# Patient Record
Sex: Male | Born: 1990 | Race: Black or African American | Hispanic: No | Marital: Single | Smoking: Never smoker
Health system: Southern US, Community
[De-identification: ages and names within clinical notes are randomized; demographics above are authoritative.]

---
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
## 2011-05-11 ENCOUNTER — Encounter (HOSPITAL_COMMUNITY): Payer: Self-pay

## 2011-05-11 ENCOUNTER — Emergency Department (HOSPITAL_COMMUNITY)
Admission: EM | Admit: 2011-05-11 | Discharge: 2011-05-11 | Disposition: A | Discharge status: Home / Self Care | Payer: Medicaid - Out of State | Attending: Emergency Medicine | Admitting: Emergency Medicine

## 2011-05-11 DIAGNOSIS — IMO0002 Reserved for concepts with insufficient information to code with codable children: Secondary | ICD-10-CM

## 2011-05-11 DIAGNOSIS — L03019 Cellulitis of unspecified finger: Secondary | ICD-10-CM | POA: Insufficient documentation

## 2011-05-11 MED ORDER — BUPIVACAINE HCL (PF) 0.5 % IJ SOLN
20.0000 mL | Freq: Once | INTRAMUSCULAR | Status: DC
  Filled 2011-05-11: qty 30

## 2011-05-11 MED ORDER — HYDROCODONE-ACETAMINOPHEN 5-325 MG PO TABS
1.0000 | ORAL_TABLET | Freq: Four times a day (QID) | ORAL | Status: AC | PRN
Start: 2011-05-11 — End: 2011-05-21

## 2011-05-11 NOTE — ED Notes (Signed)
Left thumb swelling started 2 days ago. Hangnail per pt.

## 2011-05-11 NOTE — ED Provider Notes (Signed)
History     CSN: [id number]  Arrival date & time 05/11/11  0003   First MD Initiated Contact with Patient 05/11/11 0055      Chief Complaint  Patient presents with  . Finger swelling     (Consider location/radiation/quality/duration/timing/severity/associated sxs/prior treatment) HPI This is a 21 year old black male with a two-day history of pain along the radial side of his left thumbnail. He states it started as a hangnail. There is now moderate pain and tenderness associated with it. There is no pain or tenderness of the pad of the left for him. He denies systemic symptoms such as fever or chills. He denies frank injury. The pain is worse with movement or palpation. He has not done anything to treat the pain.  History reviewed. No pertinent past medical history.  History reviewed. No pertinent past surgical history.  History reviewed. No pertinent family history.  History  Substance Use Topics  . Smoking status: Not on file  . Smokeless tobacco: Not on file  . Alcohol Use: Not on file      Review of Systems  All other systems reviewed and are negative.    Allergies  Amoxicillin  Home Medications  No current outpatient prescriptions on file.  BP 129/64  Pulse 66  Temp(Src) 98.3 F (36.8 C) (Oral)  Resp 20  Ht 5\' 9"  (1.753 m)  Wt 122 lb (55.339 kg)  BMI 18.02 kg/m2  SpO2 98%  Physical Exam General: Well-developed, well-nourished male in no acute distress; appearance consistent with age of record HENT: normocephalic, atraumatic Eyes: normal appearance Neck: supple Heart: regular rate and rhythm Lungs: normal respiratory effort and excursion Abdomen: Soft; nondistended Extremities: No deformity; full range of motion; pulses normal; tenderness and swelling along the radial side of the left thumbnail consistent with paronychia, no tenderness or swelling of pad to suggest a felon Neurologic: Awake, alert and oriented; motor function intact in all extremities  and symmetric; no facial droop Skin: Warm and dry Psychiatric: Normal mood and affect    ED Course  Drain paronychia Date/Time: 05/11/2011 1:49 AM Performed by: Hanley Seamen Authorized by: Hanley Seamen Consent: Verbal consent obtained. Risks and benefits: risks, benefits and alternatives were discussed Consent given by: patient Time out: Immediately prior to procedure a "time out" was called to verify the correct patient, procedure, equipment, support staff and site/side marked as required. Preparation: Patient was prepped and draped in the usual sterile fashion. Local anesthesia used: digital block. Patient tolerance: Patient tolerated the procedure well with no immediate complications. Comments: Moderate pus obtained on incision of left thumb. Pus sent for culture and sensitivity.   (including critical care time)     MDM           Hanley Seamen, MD 05/11/11 0150

## 2011-05-14 LAB — CULTURE, ROUTINE-ABSCESS

## 2011-05-15 NOTE — ED Notes (Signed)
Chart sent to EDP office for review °

## 2011-05-17 NOTE — ED Notes (Signed)
Chart back from EDP office.. No further treatment necessary

## 2011-05-22 ENCOUNTER — Emergency Department (HOSPITAL_COMMUNITY)
Admission: EM | Admit: 2011-05-22 | Discharge: 2011-05-22 | Disposition: A | Discharge status: Home / Self Care | Payer: Medicaid - Out of State | Attending: Emergency Medicine | Admitting: Emergency Medicine

## 2011-05-22 ENCOUNTER — Encounter (HOSPITAL_COMMUNITY): Payer: Self-pay | Admitting: *Deleted

## 2011-05-22 DIAGNOSIS — Z5189 Encounter for other specified aftercare: Secondary | ICD-10-CM

## 2011-05-22 DIAGNOSIS — L03012 Cellulitis of left finger: Secondary | ICD-10-CM

## 2011-05-22 DIAGNOSIS — L03019 Cellulitis of unspecified finger: Secondary | ICD-10-CM | POA: Insufficient documentation

## 2011-05-22 MED ORDER — IBUPROFEN 600 MG PO TABS: 600.0000 mg | ORAL_TABLET | Freq: Four times a day (QID) | ORAL | Status: AC | PRN

## 2011-05-22 NOTE — ED Provider Notes (Signed)
History     CSN: [id number]  Arrival date & time 05/22/11  1608   First MD Initiated Contact with Patient 05/22/11 1615      Chief Complaint  Patient presents with  . Wound Check    (Consider location/radiation/quality/duration/timing/severity/associated sxs/prior treatment) Patient is a 21 y.o. male presenting with wound check. The history is provided by the patient.  Wound Check  He was treated in the ED 10 to 14 days ago. Prior ED Treatment: Drainage of paronychia. Treatments tried: Narcotic pain reliever. There has been no drainage from the wound. The redness has improved. The swelling has improved. The pain has improved. He has no difficulty moving the affected extremity or digit.    History reviewed. No pertinent past medical history.  History reviewed. No pertinent past surgical history.  No family history on file.  History  Substance Use Topics  . Smoking status: Not on file  . Smokeless tobacco: Not on file  . Alcohol Use: Not on file      Review of Systems  Constitutional: Negative for fever.  Musculoskeletal: Negative for joint swelling and arthralgias.  Skin: Negative.  Negative for color change, rash and wound.  Hematological: Negative.   Psychiatric/Behavioral: Negative.     Allergies  Amoxicillin  Home Medications   Current Outpatient Rx  Name Route Sig Dispense Refill  . HYDROCODONE-ACETAMINOPHEN 5-325 MG PO TABS Oral Take 1-2 tablets by mouth every 6 (six) hours as needed for pain. 20 tablet 0  . IBUPROFEN 600 MG PO TABS Oral Take 1 tablet (600 mg total) by mouth every 6 (six) hours as needed for pain. 20 tablet 0    BP 113/67  Pulse 75  Temp 97.7 F (36.5 C)  Resp 16  Ht 5\' 9"  (1.753 m)  Wt 132 lb (59.875 kg)  BMI 19.49 kg/m2  SpO2 100%  Physical Exam  Nursing note and vitals reviewed. Constitutional: He is oriented to person, place, and time. He appears well-developed and well-nourished.  HENT:  Head: Normocephalic and atraumatic.   Neck: Neck supple.  Cardiovascular: Normal rate and intact distal pulses.   Pulmonary/Chest: Effort normal.  Musculoskeletal: Normal range of motion. He exhibits no edema and no tenderness.  Neurological: He is alert and oriented to person, place, and time.  Skin: Skin is warm and dry.       Distal left thumb at the site of I&D has completely sealed, slightly pink skin without erythema, tenderness or fluctuance or induration.  Appears to be healing well.  Psychiatric: He has a normal mood and affect.    ED Course  Procedures (including critical care time)  Labs Reviewed - No data to display No results found.   1. Paronychia of left thumb   2. Visit for wound check       MDM  Patient has continued to the hydrocodone he was originally prescribed, not specifically for pain relief, but thought he was supposed to take it.  Complaint that this medication makes him very drowsy.  I did prescribe ibuprofen 600 mg to use when necessary if he has any continued discomfort.  Otherwise encouraged when necessary followup.  This infection appears to be resolved at this time.        Candis Musa, PA 05/22/11 1724

## 2011-05-22 NOTE — Discharge Instructions (Signed)
Paronychia Paronychia is an inflammatory reaction involving the folds of the skin surrounding the fingernail. This is commonly caused by an infection in the skin around a nail. The most common cause of paronychia is frequent wetting of the hands (as seen with bartenders, food servers, nurses or others who wet their hands). This makes the skin around the fingernail susceptible to infection by bacteria (germs) or fungus. Other predisposing factors are:  Aggressive manicuring.   Nail biting.   Thumb sucking.  The most common cause is a staphylococcal (a type of germ) infection, or a fungal (Candida) infection. When caused by a germ, it usually comes on suddenly with redness, swelling, pus and is often painful. It may get under the nail and form an abscess (collection of pus), or form an abscess around the nail. If the nail itself is infected with a fungus, the treatment is usually prolonged and may require oral medicine for up to one year. Your caregiver will determine the length of time treatment is required. The paronychia caused by bacteria (germs) may largely be avoided by not pulling on hangnails or picking at cuticles. When the infection occurs at the tips of the finger it is called felon. When the cause of paronychia is from the herpes simplex virus (HSV) it is called herpetic whitlow. TREATMENT  When an abscess is present treatment is often incision and drainage. This means that the abscess must be cut open so the pus can get out. When this is done, the following home care instructions should be followed. HOME CARE INSTRUCTIONS   It is important to keep the affected fingers very dry. Rubber or plastic gloves over cotton gloves should be used whenever the hand must be placed in water.   Keep wound clean, dry and dressed as suggested by your caregiver between warm soaks or warm compresses.   Soak in warm water for fifteen to twenty minutes three to four times per day for bacterial infections.  Fungal infections are very difficult to treat, so often require treatment for long periods of time.   For bacterial (germ) infections take antibiotics (medicine which kill germs) as directed and finish the prescription, even if the problem appears to be solved before the medicine is gone.   Only take over-the-counter or prescription medicines for pain, discomfort, or fever as directed by your caregiver.  SEEK IMMEDIATE MEDICAL CARE IF:  You have redness, swelling, or increasing pain in the wound.   You notice pus coming from the wound.   You have a fever.   You notice a bad smell coming from the wound or dressing.  Document Released: 08/13/2000 Document Revised: 02/06/2011 Document Reviewed: 04/14/2008 [facility] Patient Information 2012 [place], [place].    Start taking the new medication as needed for any discomfort in place of your previous pain medicine - this will not make you sleepy. Your infection appears to be healing well.  Get rechecked if it does not continue to resolve completely.

## 2011-05-22 NOTE — ED Notes (Signed)
States he was seen on the 10th of the month for problems with left thumb, states his thumb has not healed

## 2011-05-24 NOTE — ED Provider Notes (Signed)
Medical screening examination/treatment/procedure(s) were performed by non-physician practitioner and as supervising physician I was immediately available for consultation/collaboration.  Linnette Panella T Soriya Worster, MD 05/24/11 1011 

## 2011-07-11 ENCOUNTER — Emergency Department (HOSPITAL_COMMUNITY)
Admission: EM | Admit: 2011-07-11 | Discharge: 2011-07-11 | Disposition: A | Discharge status: Home / Self Care | Payer: Medicaid - Out of State | Attending: Emergency Medicine | Admitting: Emergency Medicine

## 2011-07-11 ENCOUNTER — Encounter (HOSPITAL_COMMUNITY): Payer: Self-pay

## 2011-07-11 DIAGNOSIS — H6120 Impacted cerumen, unspecified ear: Secondary | ICD-10-CM | POA: Insufficient documentation

## 2011-07-11 DIAGNOSIS — H6121 Impacted cerumen, right ear: Secondary | ICD-10-CM

## 2011-07-11 NOTE — ED Notes (Signed)
Pt states his right ear is "plugged"  And feels pressure, denies pain or drainage, nad

## 2011-07-11 NOTE — ED Provider Notes (Signed)
History     CSN: [id number]  Arrival date & time 07/11/11  0101   First MD Initiated Contact with Patient 07/11/11 0204      Chief Complaint  Patient presents with  . Otalgia    (Consider location/radiation/quality/duration/timing/severity/associated sxs/prior treatment) HPI  Jeffrey Steele is a 21 y.o. male who presents to the Emergency Department complaining of right ear pain and a feeling that the ear is plugged. Associated with pressure. No fever, chills, ear drainage, dizziness.  No PCP   .History reviewed. No pertinent past medical history.  History reviewed. No pertinent past surgical history.  No family history on file.  History  Substance Use Topics  . Smoking status: Never Smoker   . Smokeless tobacco: Not on file  . Alcohol Use: No      Review of Systems  Constitutional: Negative for fever.       10 Systems reviewed and are negative for acute change except as noted in the HPI.  HENT: Positive for ear pain. Negative for congestion.   Eyes: Negative for discharge and redness.  Respiratory: Negative for cough and shortness of breath.   Cardiovascular: Negative for chest pain.  Gastrointestinal: Negative for vomiting and abdominal pain.  Musculoskeletal: Negative for back pain.  Skin: Negative for rash.  Neurological: Negative for syncope, numbness and headaches.  Psychiatric/Behavioral:       No behavior change.    Allergies  Amoxicillin  Home Medications  No current outpatient prescriptions on file.  BP 122/67  Pulse 56  Temp(Src) 98 F (36.7 C) (Oral)  Resp 16  Ht 5\' 9"  (1.753 m)  Wt 132 lb (59.875 kg)  BMI 19.49 kg/m2  SpO2 98%  Physical Exam  Nursing note and vitals reviewed. Constitutional:       Awake, alert, nontoxic appearance.  HENT:  Head: Normocephalic and atraumatic.  Left Ear: External ear normal.       Right ear with cerumen impaction  Eyes: Right eye exhibits no discharge. Left eye exhibits no discharge.  Neck: Neck  supple.  Pulmonary/Chest: Effort normal. He exhibits no tenderness.  Abdominal: Soft. There is no tenderness. There is no rebound.  Musculoskeletal: He exhibits no tenderness.       Baseline ROM, no obvious new focal weakness.  Neurological:       Mental status and motor strength appears baseline for patient and situation.  Skin: No rash noted.  Psychiatric: He has a normal mood and affect.    ED Course  Procedures (including critical care time)     MDM  Patient presents with a right ear pain and a sensation of the ear being plugged. There is a cerumen impaction. Nursing irrigated the right ear with success. Pt feels improved after observation and/or treatment in ED.Pt stable in ED with no significant deterioration in condition.The patient appears reasonably screened and/or stabilized for discharge and I doubt any other medical condition or other [facility] requiring further screening, evaluation, or treatment in the ED at this time prior to discharge.  MDM Reviewed: nursing note and vitals           Nicoletta Dress. Colon Branch, MD 07/11/11 [phone]

## 2011-07-11 NOTE — Discharge Instructions (Signed)
Your ear had an excessive build up of wax. We have cleaned it out while you were here.

## 2011-09-23 ENCOUNTER — Emergency Department (HOSPITAL_COMMUNITY)
Admission: EM | Admit: 2011-09-23 | Discharge: 2011-09-23 | Disposition: A | Discharge status: Home / Self Care | Payer: Medicaid - Out of State | Attending: Emergency Medicine | Admitting: Emergency Medicine

## 2011-09-23 ENCOUNTER — Emergency Department (HOSPITAL_COMMUNITY): Payer: Medicaid - Out of State

## 2011-09-23 ENCOUNTER — Encounter (HOSPITAL_COMMUNITY): Payer: Self-pay | Admitting: *Deleted

## 2011-09-23 DIAGNOSIS — M25519 Pain in unspecified shoulder: Secondary | ICD-10-CM | POA: Insufficient documentation

## 2011-09-23 DIAGNOSIS — S4350XA Sprain of unspecified acromioclavicular joint, initial encounter: Secondary | ICD-10-CM

## 2011-09-23 DIAGNOSIS — Y9301 Activity, walking, marching and hiking: Secondary | ICD-10-CM | POA: Insufficient documentation

## 2011-09-23 DIAGNOSIS — W010XXA Fall on same level from slipping, tripping and stumbling without subsequent striking against object, initial encounter: Secondary | ICD-10-CM | POA: Insufficient documentation

## 2011-09-23 MED ORDER — IBUPROFEN 800 MG PO TABS: 800.0000 mg | ORAL_TABLET | Freq: Three times a day (TID) | ORAL | Status: AC

## 2011-09-23 NOTE — ED Provider Notes (Signed)
Medical screening examination/treatment/procedure(s) were performed by non-physician practitioner and as supervising physician I was immediately available for consultation/collaboration.   Lyanne Co, MD 09/23/11 1745

## 2011-09-23 NOTE — ED Provider Notes (Signed)
History     CSN: [id number]  Arrival date & time 09/23/11  1627   First MD Initiated Contact with Patient 09/23/11 1728      Chief Complaint  Patient presents with  . Shoulder Pain    (Consider location/radiation/quality/duration/timing/severity/associated sxs/prior treatment) HPI Comments: Patient states that while walking on yesterday July 22, he fell backwards and injured the right shoulder. Since that time he has had pain with raising the arm over his head. He's not had any problem dropping objects. He's been able to put on his pullover shirts with minimal problem. The patient denies any previous operations or procedures to the right arm or shoulder.   Patient is a 21 y.o. male presenting with shoulder pain. The history is provided by the patient.  Shoulder Pain Pertinent negatives include no abdominal pain, arthralgias, chest pain, coughing or neck pain.    History reviewed. No pertinent past medical history.  History reviewed. No pertinent past surgical history.  No family history on file.  History  Substance Use Topics  . Smoking status: Never Smoker   . Smokeless tobacco: Not on file  . Alcohol Use: No      Review of Systems  Constitutional: Negative for activity change.       All ROS Neg except as noted in HPI  HENT: Negative for nosebleeds and neck pain.   Eyes: Negative for photophobia and discharge.  Respiratory: Negative for cough, shortness of breath and wheezing.   Cardiovascular: Negative for chest pain and palpitations.  Gastrointestinal: Negative for abdominal pain and blood in stool.  Genitourinary: Negative for dysuria, frequency and hematuria.  Musculoskeletal: Negative for back pain and arthralgias.  Skin: Negative.   Neurological: Negative for dizziness, seizures and speech difficulty.  Psychiatric/Behavioral: Negative for hallucinations and confusion.    Allergies  Amoxicillin  Home Medications  No current outpatient prescriptions on  file.  BP 111/59  Pulse 65  Temp 97.4 F (36.3 C)  Resp 20  Ht 5\' 9"  (1.753 m)  Wt 135 lb (61.236 kg)  BMI 19.94 kg/m2  SpO2 100%  Physical Exam  Nursing note and vitals reviewed. Constitutional: He is oriented to person, place, and time. He appears well-developed and well-nourished.  Non-toxic appearance.  HENT:  Head: Normocephalic.  Right Ear: Tympanic membrane and external ear normal.  Left Ear: Tympanic membrane and external ear normal.  Eyes: EOM and lids are normal. Pupils are equal, round, and reactive to light.  Neck: Normal range of motion. Neck supple. Carotid bruit is not present.  Cardiovascular: Normal rate, regular rhythm, normal heart sounds, intact distal pulses and normal pulses.   Pulmonary/Chest: Breath sounds normal. No respiratory distress.  Abdominal: Soft. Bowel sounds are normal. There is no tenderness. There is no guarding.  Musculoskeletal: Normal range of motion.       There is pain with attempted range of motion of the right shoulder. There is no obvious deformity of the right shoulder. There is pain and soreness with raising the right arm above the head. There is no evidence of dislocation. Distal pulses and sensory are intact.  Lymphadenopathy:       Head (right side): No submandibular adenopathy present.       Head (left side): No submandibular adenopathy present.    He has no cervical adenopathy.  Neurological: He is alert and oriented to person, place, and time. He has normal strength. No cranial nerve deficit or sensory deficit.  Skin: Skin is warm and dry.  Psychiatric: He  has a normal mood and affect. His speech is normal.    ED Course  Procedures (including critical care time)  Labs Reviewed - No data to display Dg Shoulder Right  09/23/2011  *RADIOLOGY REPORT*  Clinical Data: Pain after fall.  RIGHT SHOULDER - 2+ VIEW  Comparison: None.  Findings: The distal clavicle is slightly elevated and mild AC joint separation may have occurred.   Clinical correlation recommended.  Otherwise, no evidence of fracture or dislocation.  IMPRESSION: The distal clavicle is slightly elevated and mild AC joint separation may have occurred.  Clinical correlation recommended. Otherwise, no evidence of fracture or dislocation.  Original Report Authenticated By: Fuller Canada, M.D.   Pulse oximetry 100% oral air. Within normal limits by my interpretation.  No diagnosis found.    MDM  I have reviewed nursing notes, vital signs, and all appropriate lab and imaging results for this patient. The x-ray of the right shoulder shows the distal clavicle slightly elevated and mild a.c. joint separation. No definite fracture appreciated. The patient is fitted with a shoulder immobilizer. He is referred to orthopedics for additional evaluation. Patient is advised to use ibuprofen 800 mg 3 times daily for soreness and inflammation.       Kathie Dike, [place] 09/23/11 1744

## 2011-09-23 NOTE — ED Notes (Signed)
Pt c/o rt shoulder pain after falling yesterday. Pt states it hurts to move his shoulder.

## 2011-09-23 NOTE — ED Notes (Signed)
Pain in right upper arm and shoulder area , states he fell yesterday

## 2013-03-01 ENCOUNTER — Encounter (HOSPITAL_COMMUNITY): Payer: Self-pay | Admitting: Emergency Medicine

## 2013-03-01 ENCOUNTER — Emergency Department (HOSPITAL_COMMUNITY)
Admission: EM | Admit: 2013-03-01 | Discharge: 2013-03-01 | Disposition: A | Discharge status: Home / Self Care | Payer: Medicaid - Out of State | Attending: Emergency Medicine | Admitting: Emergency Medicine

## 2013-03-01 DIAGNOSIS — R059 Cough, unspecified: Secondary | ICD-10-CM | POA: Insufficient documentation

## 2013-03-01 DIAGNOSIS — R05 Cough: Secondary | ICD-10-CM | POA: Insufficient documentation

## 2013-03-01 DIAGNOSIS — R066 Hiccough: Secondary | ICD-10-CM

## 2013-03-01 MED ORDER — CHLORPROMAZINE HCL 25 MG PO TABS: 25.0000 mg | ORAL_TABLET | Freq: Three times a day (TID) | ORAL | Status: AC

## 2013-03-01 MED ORDER — CHLORPROMAZINE HCL 25 MG/ML IJ SOLN
50.0000 mg | Freq: Once | INTRAMUSCULAR | Status: AC
  Administered 2013-03-01: 50 mg via INTRAMUSCULAR
  Filled 2013-03-01: qty 2

## 2013-03-01 NOTE — ED Notes (Signed)
Pt sleeping, girlfriend states pt has not had hiccups in a while

## 2013-03-01 NOTE — ED Notes (Signed)
Onset of hiccups this am, stops for brief period the begins again.

## 2013-03-01 NOTE — ED Provider Notes (Signed)
CSN: [id number]     Arrival date & time 03/01/13  2003 History   First MD Initiated Contact with Patient 03/01/13 2044     Chief Complaint  Patient presents with  . Hiccups   (Consider location/radiation/quality/duration/timing/severity/associated sxs/prior Treatment) HPI Comments: CHRISS Jeffrey Steele is a 22 y.o. male who presents to the Emergency Department complaining of persistent hiccups.  States that he began coughing this morning and developed hiccups shortly after.  He states the symptoms have been waxing and waning all day.  He denies fever, chills, headaches, abdominal pain, shortness of breath or vomiting.  Drank water and took robitussin today w/o relief.    The history is provided by the patient.    History reviewed. No pertinent past medical history. History reviewed. No pertinent past surgical history. No family history on file. History  Substance Use Topics  . Smoking status: Never Smoker   . Smokeless tobacco: Not on file  . Alcohol Use: No    Review of Systems  Constitutional: Negative for fever, chills, activity change and appetite change.  HENT: Negative for congestion, trouble swallowing and voice change.        Hiccups  Respiratory: Positive for cough. Negative for chest tightness and shortness of breath.   Cardiovascular: Negative for chest pain.  Gastrointestinal: Negative for nausea, vomiting and abdominal pain.  Skin: Negative for rash.  Neurological: Negative for dizziness, speech difficulty and headaches.  All other systems reviewed and are negative.    Allergies  Amoxicillin  Home Medications   Current Outpatient Rx  Name  Route  Sig  Dispense  Refill  . acetaminophen (TYLENOL) 325 MG tablet   Oral   Take 650 mg by mouth every 6 (six) hours as needed.         Marland Kitchen guaifenesin (ROBITUSSIN) 100 MG/5ML syrup   Oral   Take 200 mg by mouth 3 (three) times daily as needed for cough.          BP 129/62  Pulse 96  Temp(Src) 98.2 F (36.8 C)  (Oral)  Resp 18  Ht 5\' 9"  (1.753 m)  Wt 132 lb (59.875 kg)  BMI 19.48 kg/m2  SpO2 100%  Physical Exam  Nursing note and vitals reviewed. Constitutional: He is oriented to person, place, and time. He appears well-developed and well-nourished. No distress.  HENT:  Head: Normocephalic and atraumatic.  Right Ear: Tympanic membrane and ear canal normal.  Left Ear: Tympanic membrane and ear canal normal.  Mouth/Throat: Uvula is midline, oropharynx is clear and moist and mucous membranes are normal. No oropharyngeal exudate.  Persistent hiccups, airway clear, no edema.  Eyes: EOM are normal. Pupils are equal, round, and reactive to light.  Neck: Normal range of motion, full passive range of motion without pain and phonation normal. Neck supple. No thyromegaly present.  Cardiovascular: Normal rate, regular rhythm, normal heart sounds and intact distal pulses.   No murmur heard. Pulmonary/Chest: Effort normal and breath sounds normal. No stridor. No respiratory distress. He has no wheezes. He has no rales. He exhibits no tenderness.  Abdominal: Soft. He exhibits no distension and no mass. There is no tenderness. There is no rebound and no guarding.  Musculoskeletal: Normal range of motion. He exhibits no edema.  Lymphadenopathy:    He has no cervical adenopathy.  Neurological: He is alert and oriented to person, place, and time. He exhibits normal muscle tone. Coordination normal.  Skin: Skin is warm and dry.    ED Course  Procedures (  including critical care time) Labs Review Labs Reviewed - No data to display Imaging Review No results found.  EKG Interpretation   None       MDM   Patient with intermittent episodes of hiccups after coughing.  Likely result of irritation to the diaphragm.  Will give IM thorazine.    Patient is otherwise well appearing, airway patent, VSS  Sx's resolved, patient resting comfortably.  Appears stable for discharge.     Shamira Toutant L. Trisha Mangle,  PA-C 03/02/13 0126

## 2013-03-01 NOTE — ED Notes (Signed)
Patient given discharge instruction, verbalized understand. Patient ambulatory out of the department.  

## 2013-03-01 NOTE — Discharge Instructions (Signed)
Hiccups °Hiccups are caused by a sudden contraction of the muscles between the ribs and the muscle under your lungs (diaphragm). When you hiccup, the top of your windpipe (glottis) closes immediately after your diaphragm contracts. This makes the typical 'hic' sound. A hiccup is a reflex that you cannot stop. Unlike other reflexes such as coughing and sneezing, hiccups do not seem to have any useful purpose. There are 3 types of hiccups:  °· Benign bouts: last less than 48 hours. °· Persistent: last more than 48 hours but less than 1 month. °· Intractable: last more than 1 month. °CAUSES  °Most people have bouts of hiccups from time to time. They start for no apparent reason, last a short while, and then stop. Sometimes they are due to: °· A temporary swollen stomach caused by overeating or eating too fast, eating spicy foods, drinking fizzy drinks, or swallowing air. °· A sudden change in temperature (very hot or cold foods or drinks, a cold shower). °· Drinking alcohol or using tobacco. °There are no particular tests used to diagnose hiccups. Hiccups are usually considered harmless and do not point to a serious medical condition. However, there can be underlying medical problems that may cause hiccups, such as pneumonia, diabetes, metabolic problems, tumors, abdominal infections or injuries, and neurologic problems. You must follow up with your caregiver if your symptoms persist or become a frequent problem. °TREATMENT  °· Most cases need no treatment. A bout of benign hiccups usually does not last long. °· Medicine is sometimes needed to stop persistent hiccups. Medicine may be given intravenously (IV) or by mouth. °· Hypnosis or acupuncture may be suggested. °· Surgery to affect the nerve that supplies the diaphragm may be tried in severe cases. °· Treatment of an underlying cause is needed in some cases. °HOME CARE INSTRUCTIONS  °Popular remedies that may stop a bout of hiccups include: °· Gargling ice  water. °· Swallowing granulated sugar. °· Biting on a lemon. °· Holding your breath, breathing fast, or breathing into a paper bag. °· Bearing down. °· Gasping after a sudden fright. °· Pulling your tongue gently. °· Distraction. °SEEK MEDICAL CARE IF:  °· Hiccups last for more than 48 hours. °· You are given medicine but your hiccups do not get better. °· New symptoms show up. °· You cannot sleep or eat due to the hiccups. °· You have unexpected weight loss. °· You have trouble breathing or swallowing. °· You develop severe pain in your abdomen or other areas. °· You develop numbness, tingling, or weakness. °Document Released: 04/28/2001 Document Revised: 05/12/2011 Document Reviewed: 04/10/2010 °[employer]® Patient Information ©2014 [employer]. ° °

## 2013-03-04 NOTE — ED Provider Notes (Signed)
Medical screening examination/treatment/procedure(s) were performed by non-physician practitioner and as supervising physician I was immediately available for consultation/collaboration.  EKG Interpretation   None        Simrin Vegh R. Vinnie Gombert, MD 03/04/13 2217 

## 2013-07-07 IMAGING — CR DG SHOULDER 2+V*R*
3 series · 3 of 3 positions shown · non-contrast
Comparison: None.

CLINICAL DATA: Pain after fall.

RIGHT SHOULDER - 2+ VIEW

[view not recorded (1 of 3)]
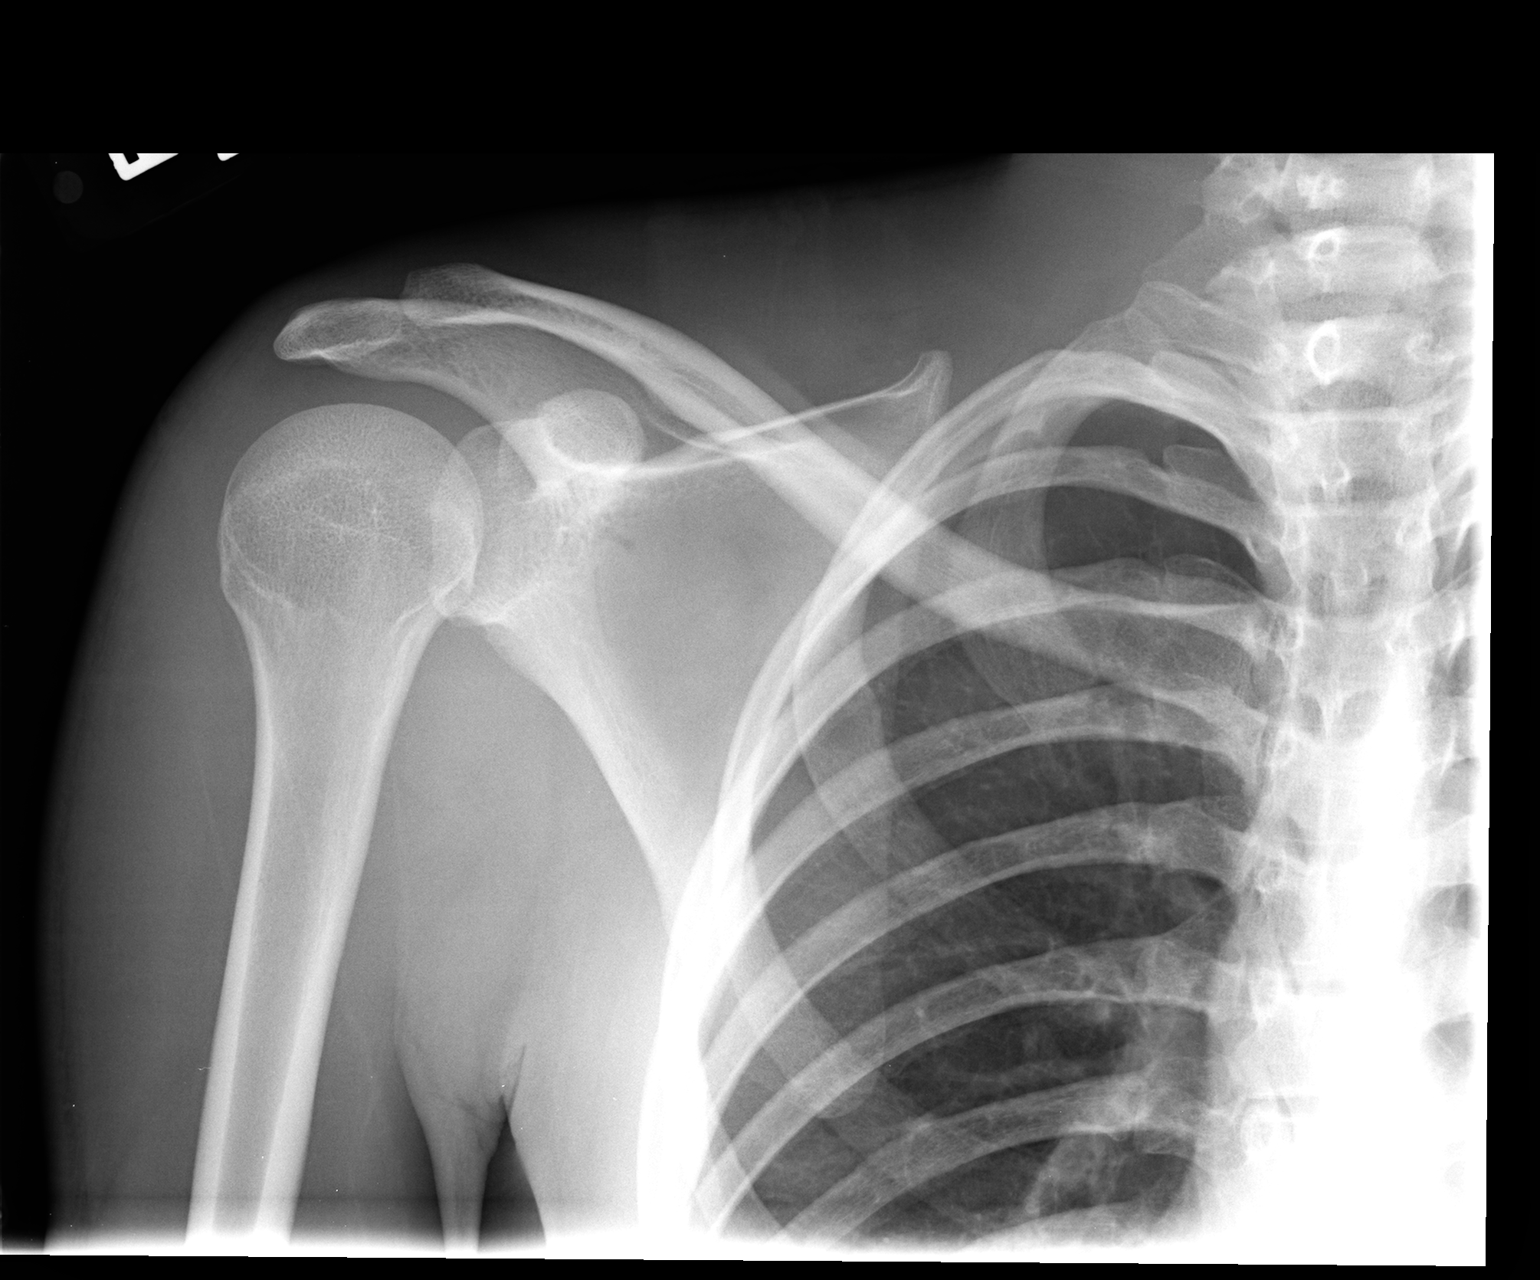

[view not recorded (2 of 3)]
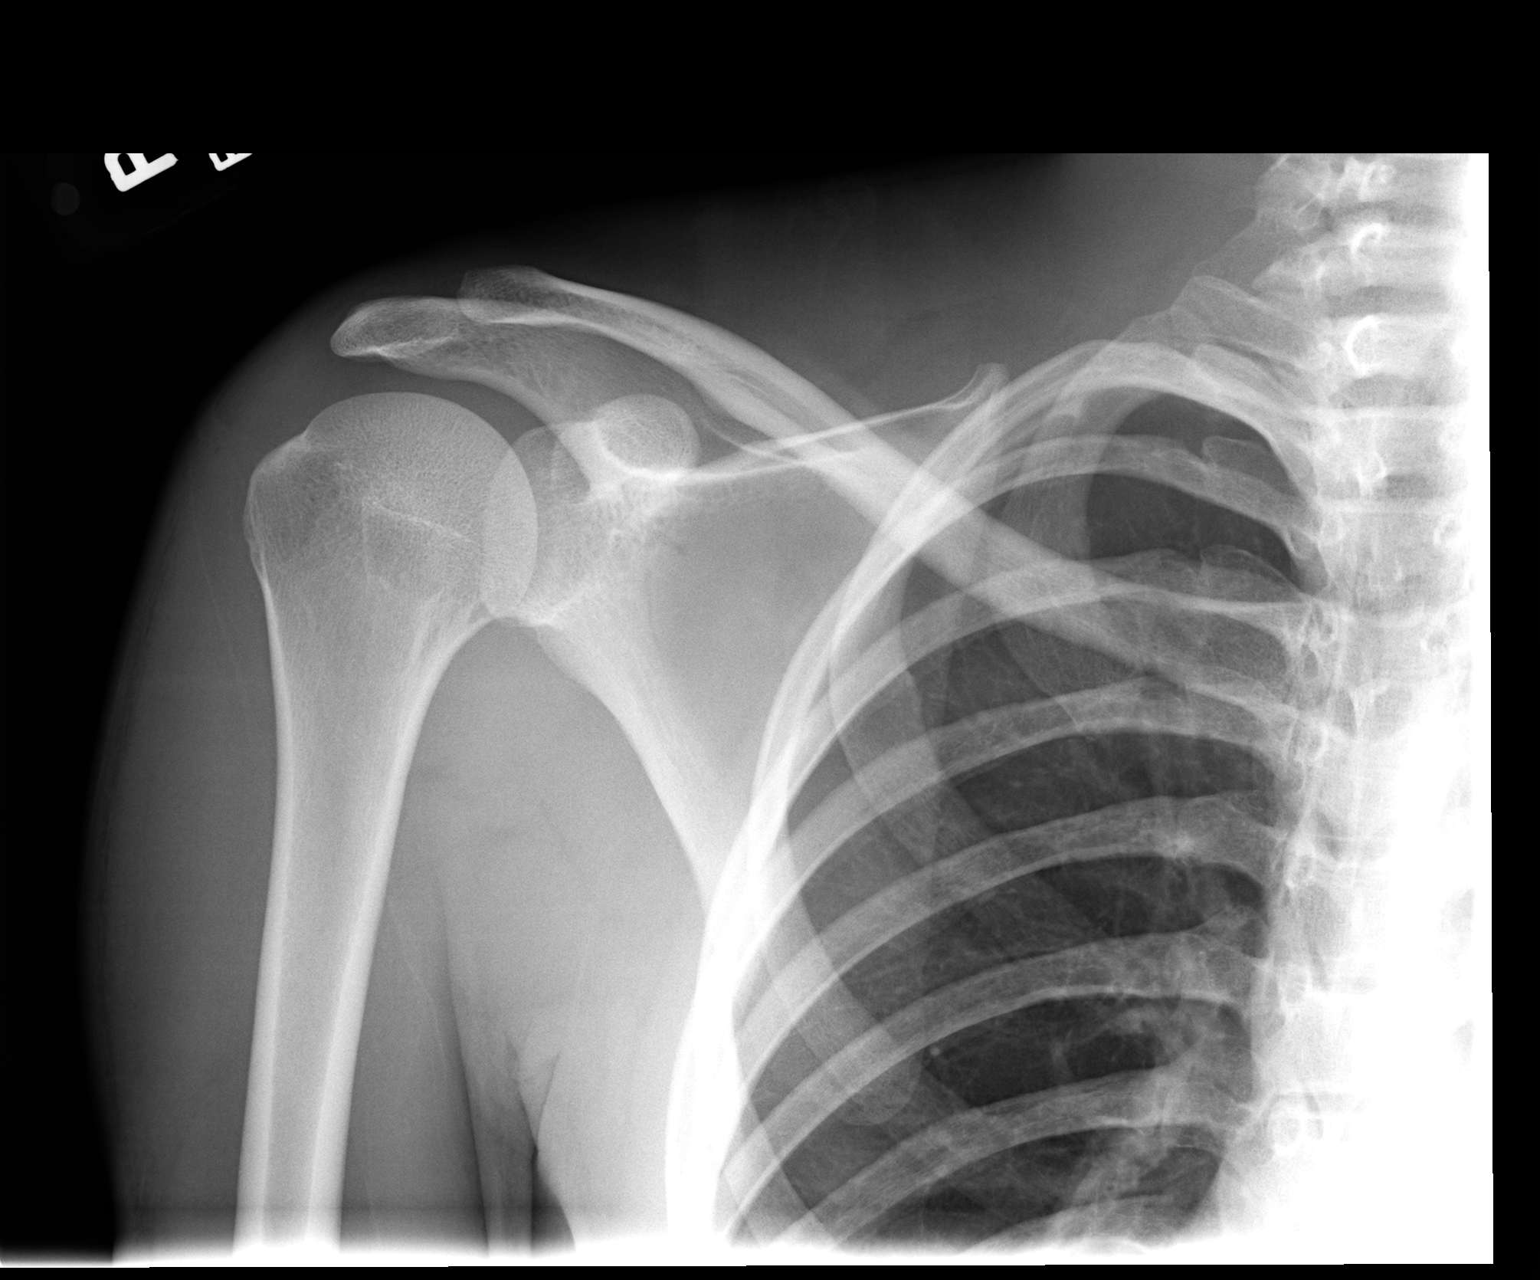

[view not recorded (3 of 3)]
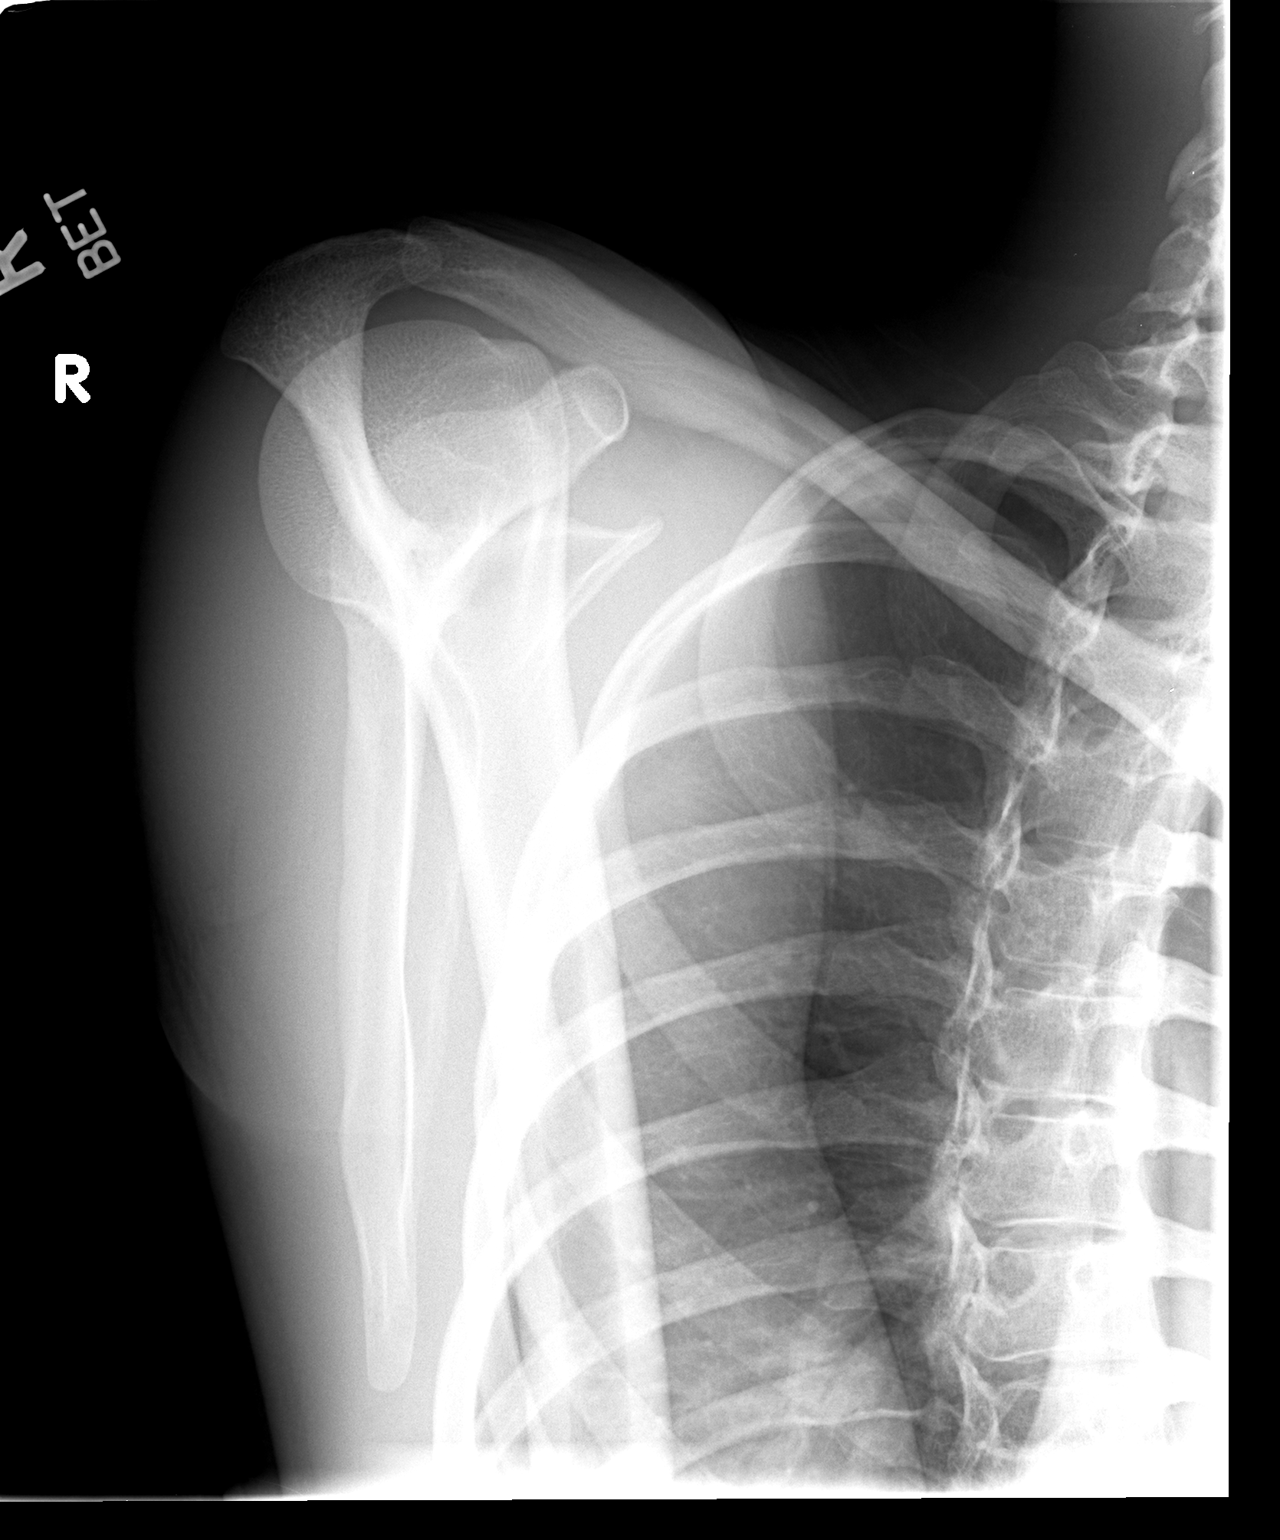

[3 of 3 positions shown; findings below may reference images not displayed]

FINDINGS: The distal clavicle is slightly elevated and mild AC
joint separation may have occurred.  Clinical correlation
recommended.  Otherwise, no evidence of fracture or dislocation.
IMPRESSION: The distal clavicle is slightly elevated and mild AC joint
separation may have occurred.  Clinical correlation recommended.
Otherwise, no evidence of fracture or dislocation.

## 2017-04-20 ENCOUNTER — Other Ambulatory Visit: Payer: Self-pay

## 2017-04-20 ENCOUNTER — Emergency Department (HOSPITAL_COMMUNITY)
Admission: EM | Admit: 2017-04-20 | Discharge: 2017-04-20 | Disposition: A | Discharge status: Home / Self Care | Payer: Medicaid - Out of State | Attending: Emergency Medicine | Admitting: Emergency Medicine

## 2017-04-20 ENCOUNTER — Encounter (HOSPITAL_COMMUNITY): Payer: Self-pay | Admitting: Emergency Medicine

## 2017-04-20 DIAGNOSIS — Z79899 Other long term (current) drug therapy: Secondary | ICD-10-CM | POA: Insufficient documentation

## 2017-04-20 DIAGNOSIS — J40 Bronchitis, not specified as acute or chronic: Secondary | ICD-10-CM | POA: Diagnosis not present

## 2017-04-20 DIAGNOSIS — R509 Fever, unspecified: Secondary | ICD-10-CM | POA: Diagnosis present

## 2017-04-20 MED ORDER — PREDNISONE 20 MG PO TABS: 40.0000 mg | ORAL_TABLET | Freq: Every day | ORAL | 0 refills | Status: AC

## 2017-04-20 MED ORDER — BENZONATATE 100 MG PO CAPS
200.0000 mg | ORAL_CAPSULE | Freq: Once | ORAL | Status: AC
  Administered 2017-04-20: 200 mg via ORAL
  Filled 2017-04-20: qty 2

## 2017-04-20 MED ORDER — ALBUTEROL SULFATE HFA 108 (90 BASE) MCG/ACT IN AERS
2.0000 | INHALATION_SPRAY | Freq: Once | RESPIRATORY_TRACT | Status: AC
  Administered 2017-04-20: 2 via RESPIRATORY_TRACT
  Filled 2017-04-20: qty 6.7

## 2017-04-20 NOTE — Discharge Instructions (Signed)
1-2 puffs of the inhaler every 4-6 hrs as needed.  Continue taking your Tussin as directed for cough.  Tylenol every 4 hrs if needed for fever.  Follow-up with your doctor or recheck or return here for any worsening symptoms

## 2017-04-20 NOTE — ED Triage Notes (Signed)
Pt c/o cough, fever, and headache since 2/11.

## 2017-04-21 NOTE — ED Provider Notes (Signed)
[facility] EMERGENCY DEPARTMENT Provider Note   CSN: [id number] Arrival date & time: 04/20/17  1858     History   Chief Complaint Chief Complaint  Patient presents with  . Fever    HPI Jeffrey Steele is a 27 y.o. male.  HPI  Jeffrey Steele is a 27 y.o. male who presents to the Emergency Department complaining of cough, low grade fever and intermittent frontal headache.  Symptoms present for one week.  Cough is occasionally productive.  He has been taking OTC cough medication with some relief.  He denies chest pain, shortness of breath, sore throat, vomiting   History reviewed. No pertinent past medical history.  There are no active problems to display for this patient.   History reviewed. No pertinent surgical history.     Home Medications    Prior to Admission medications   Medication Sig Start Date End Date Taking? Authorizing Provider  acetaminophen (TYLENOL) 325 MG tablet Take 650 mg by mouth every 6 (six) hours as needed.    [provider]  chlorproMAZINE (THORAZINE) 25 MG tablet Take 1 tablet (25 mg total) by mouth 3 (three) times daily. For 3 days 03/01/13   Aakash Hollomon, PA-C  guaifenesin (ROBITUSSIN) 100 MG/5ML syrup Take 200 mg by mouth 3 (three) times daily as needed for cough.    [provider]  predniSONE (DELTASONE) 20 MG tablet Take 2 tablets (40 mg total) by mouth daily. 04/20/17   Alyha Marines, Babette Relic, PA-C    Family History No family history on file.  Social History Social History   Tobacco Use  . Smoking status: Never Smoker  . Smokeless tobacco: Never Used  Substance Use Topics  . Alcohol use: No  . Drug use: No     Allergies   Amoxicillin   Review of Systems Review of Systems  Constitutional: Positive for fever. Negative for activity change, appetite change and chills.  HENT: Positive for congestion. Negative for ear pain, facial swelling, rhinorrhea, sore throat and trouble swallowing.   Eyes: Negative for  visual disturbance.  Respiratory: Positive for cough. Negative for shortness of breath, wheezing and stridor.   Gastrointestinal: Negative for abdominal pain, nausea and vomiting.  Genitourinary: Negative for dysuria and flank pain.  Musculoskeletal: Negative for neck pain and neck stiffness.  Skin: Negative for rash.  Neurological: Negative for dizziness, weakness, numbness and headaches.  Hematological: Negative for adenopathy.  Psychiatric/Behavioral: Negative for confusion.  All other systems reviewed and are negative.    Physical Exam Updated Vital Signs BP (!) 142/79 (BP Location: Right Arm)   Pulse 97   Temp 100.1 F (37.8 C) (Oral)   Resp 18   Ht 5\' 8"  (1.727 m)   Wt 61.2 kg (135 lb)   SpO2 98%   BMI 20.53 kg/m   Physical Exam  Constitutional: He is oriented to person, place, and time. He appears well-developed and well-nourished. No distress.  HENT:  Head: Normocephalic and atraumatic.  Right Ear: Tympanic membrane and ear canal normal.  Left Ear: Tympanic membrane and ear canal normal.  Nose: Mucosal edema and rhinorrhea present.  Mouth/Throat: Uvula is midline and mucous membranes are normal. No trismus in the jaw. No uvula swelling. Posterior oropharyngeal erythema present. No oropharyngeal exudate, posterior oropharyngeal edema or tonsillar abscesses.  Eyes: Conjunctivae are normal.  Neck: Normal range of motion and phonation normal. Neck supple. No Brudzinski's sign and no Kernig's sign noted.  Cardiovascular: Normal rate and regular rhythm.  No murmur heard. Pulmonary/Chest:  Effort normal. No respiratory distress. He has wheezes. He has no rales.  Few scattered expiratory wheezes.  No respiratory distress.    Musculoskeletal: He exhibits no edema.  Lymphadenopathy:    He has no cervical adenopathy.  Neurological: He is alert and oriented to person, place, and time. He exhibits normal muscle tone. Coordination normal.  Skin: Skin is warm and dry. Capillary  refill takes less than 2 seconds.  Nursing note and vitals reviewed.    ED Treatments / Results  Labs (all labs ordered are listed, but only abnormal results are displayed) Labs Reviewed - No data to display  EKG  EKG Interpretation None       Radiology No results found.  Procedures Procedures (including critical care time)  Medications Ordered in ED Medications  albuterol (PROVENTIL HFA;VENTOLIN HFA) 108 (90 Base) MCG/ACT inhaler 2 puff (2 puffs Inhalation Given 04/20/17 2002)  benzonatate (TESSALON) capsule 200 mg (200 mg Oral Given 04/20/17 1955)     Initial Impression / Assessment and Plan / ED Course  I have reviewed the triage vital signs and the nursing notes.  Pertinent labs & imaging results that were available during my care of the patient were reviewed by me and considered in my medical decision making (see chart for details).     Pt non-toxic appearing, vitals reviewed.  initial wheezing improved after albuterol.  MDI inhaler dispensed.  rx for steroid.  Pt agrees to plan.  Return precautions discussed.    Final Clinical Impressions(s) / ED Diagnoses   Final diagnoses:  Bronchitis    ED Discharge Orders        Ordered    predniSONE (DELTASONE) 20 MG tablet  Daily     04/20/17 2019       Pauline Ausriplett, Isidra Mings, PA-C 04/21/17 2142    Donnetta Hutchingook, Brian, MD 04/22/17 0001
